# Patient Record
Sex: Female | Born: 1980 | Race: White | Hispanic: No | Marital: Single
Health system: Southern US, Community
[De-identification: ages and names within clinical notes are randomized; demographics above are authoritative.]

## PROBLEM LIST (undated history)

## (undated) DIAGNOSIS — I10 Essential (primary) hypertension: Secondary | ICD-10-CM

---

## 2016-01-13 ENCOUNTER — Inpatient Hospital Stay (HOSPITAL_COMMUNITY): Payer: Medicaid Other

## 2016-01-13 ENCOUNTER — Inpatient Hospital Stay (HOSPITAL_COMMUNITY)
Admission: AD | Admit: 2016-01-13 | Discharge: 2016-01-14 | Disposition: A | Discharge status: Home / Self Care | Payer: Medicaid Other | Source: Ambulatory Visit | Attending: Obstetrics and Gynecology | Admitting: Obstetrics and Gynecology

## 2016-01-13 ENCOUNTER — Encounter (HOSPITAL_COMMUNITY): Payer: Self-pay

## 2016-01-13 DIAGNOSIS — N939 Abnormal uterine and vaginal bleeding, unspecified: Secondary | ICD-10-CM | POA: Diagnosis present

## 2016-01-13 HISTORY — DX: Essential (primary) hypertension: I10

## 2016-01-13 LAB — URINALYSIS, ROUTINE W REFLEX MICROSCOPIC
BILIRUBIN URINE: NEGATIVE
Glucose, UA: NEGATIVE mg/dL
HGB URINE DIPSTICK: NEGATIVE
KETONES UR: NEGATIVE mg/dL
Leukocytes, UA: NEGATIVE
NITRITE: NEGATIVE
PROTEIN: NEGATIVE mg/dL
Specific Gravity, Urine: >1.03 — ABNORMAL HIGH (ref 1.005–1.030)
pH: 5.5 (ref 5.0–8.0)

## 2016-01-13 LAB — CBC
HEMATOCRIT: 25.3 % — AB (ref 36.0–46.0)
Hemoglobin: 8.3 g/dL — ABNORMAL LOW (ref 12.0–15.0)
MCH: 27.9 pg (ref 26.0–34.0)
MCHC: 32.8 g/dL (ref 30.0–36.0)
MCV: 85.2 fL (ref 78.0–100.0)
PLATELETS: 598 10*3/uL — AB (ref 150–400)
RBC: 2.97 MIL/uL — AB (ref 3.87–5.11)
RDW: 14.4 % (ref 11.5–15.5)
WBC: 12.6 10*3/uL — AB (ref 4.0–10.5)

## 2016-01-13 NOTE — MAU Provider Note (Signed)
  History     CSN: [id number]  Arrival date and time: 01/13/16 2232   First Provider Initiated Contact with Patient 01/13/16 2301      Chief Complaint  Patient presents with  . Vaginal Bleeding   HPI Comments: Tracey Sanford is a 35 y.o. G1P1 who is [facility]. She states that she has had normal bleeding since the delivery. Then today around 1900 she had a large gush of blood that was "pouring our like water" for a few mins. She also passed a golfball sized clot at that time. She states that she had elevated blood pressure at the beginning of the pregnancy, but she was not put on any medications until postpartum. She states that she is taking labetalol 200mg  BID. She states that she has not taken her second dose today. She denies any HA, visual disturbances or RUQ pain.   Vaginal Bleeding The patient's primary symptoms include vaginal bleeding. This is a new problem. The current episode started today (around 1900). The problem occurs intermittently. The problem has been gradually worsening. The patient is experiencing no pain. She is not pregnant. Associated symptoms include nausea. Pertinent negatives include no abdominal pain, chills, constipation, diarrhea, dysuria, fever, frequency, urgency or vomiting. The vaginal bleeding is heavier than menses. She has been passing clots (about the size of a golfball. ). She has not been passing tissue. Nothing aggravates the symptoms. She has tried nothing for the symptoms.   Past Medical History  Diagnosis Date  . Hypertension     History reviewed. No pertinent past surgical history.  History reviewed. No pertinent family history.  Social History  Substance Use Topics  . Smoking status: None  . Smokeless tobacco: None  . Alcohol Use: None    Allergies: Allergies not on file  No prescriptions prior to admission    Review of Systems  Constitutional: Negative for fever and chills.  Gastrointestinal: Positive for nausea.  Negative for vomiting, abdominal pain, diarrhea and constipation.  Genitourinary: Positive for vaginal bleeding. Negative for dysuria, urgency and frequency.   Physical Exam   Blood pressure 156/82, pulse 90, temperature 98.2 F (36.8 C), temperature source Oral, resp. rate 18, SpO2 100 %.  Physical Exam  Nursing note and vitals reviewed. Constitutional: She is oriented to person, place, and time. She appears well-developed and well-nourished. No distress.  HENT:  Head: Normocephalic.  Cardiovascular: Normal rate.   Respiratory: Effort normal.  GI: Soft. There is no tenderness. There is no rebound.  Genitourinary:  Fundus: @the  SP Scant amount of bleeding at this time.   Neurological: She is alert and oriented to person, place, and time.  Skin: Skin is warm and dry.  Psychiatric: She has a normal mood and affect.    MAU Course  Procedures  MDM 0055: D/W Dr. Jolayne Pantheronstant. Will place cytotec here, and then patient can be DC home. [facility].   Assessment and Plan   1. Retained products of conception after delivery without hemorrhage    DC home Comfort measures reviewed   Bleeding precautions RX: none, continue all medications. Increase iron to TID Return to MAU as needed FU with OB as planned  Follow-up Information    Call in 1 day to follow up.   Contact information:   [facility]         Jahmad Petrich, Franco ColletHeather Donovan 01/13/2016, 11:02 PM

## 2016-01-13 NOTE — MAU Note (Signed)
Pt presents with complaint of s/p vaginal delivery on 04/07 with onset of heavier vaginal bleeding and passed large clots at home tonight. [facility] Reports mild lower abd cramping at this time, 7/10.

## 2016-01-14 LAB — COMPREHENSIVE METABOLIC PANEL
ALK PHOS: 80 U/L (ref 38–126)
ALT: 11 U/L — AB (ref 14–54)
AST: 13 U/L — AB (ref 15–41)
Albumin: 3.1 g/dL — ABNORMAL LOW (ref 3.5–5.0)
Anion gap: 9 (ref 5–15)
BILIRUBIN TOTAL: 0.2 mg/dL — AB (ref 0.3–1.2)
BUN: 14 mg/dL (ref 6–20)
CALCIUM: 8.9 mg/dL (ref 8.9–10.3)
CHLORIDE: 109 mmol/L (ref 101–111)
CO2: 25 mmol/L (ref 22–32)
CREATININE: 0.56 mg/dL (ref 0.44–1.00)
Glucose, Bld: 110 mg/dL — ABNORMAL HIGH (ref 65–99)
Potassium: 3 mmol/L — ABNORMAL LOW (ref 3.5–5.1)
Sodium: 143 mmol/L (ref 135–145)
TOTAL PROTEIN: 6.7 g/dL (ref 6.5–8.1)

## 2016-01-14 LAB — PROTEIN / CREATININE RATIO, URINE
CREATININE, URINE: 251 mg/dL
Protein Creatinine Ratio: 0.08 mg/mg{Cre} (ref 0.00–0.15)
TOTAL PROTEIN, URINE: 19 mg/dL

## 2016-01-14 MED ORDER — IBUPROFEN 800 MG PO TABS
800.0000 mg | ORAL_TABLET | Freq: Once | ORAL | Status: AC
  Administered 2016-01-14: 800 mg via ORAL
  Filled 2016-01-14: qty 1

## 2016-01-14 MED ORDER — MISOPROSTOL 200 MCG PO TABS
800.0000 ug | ORAL_TABLET | Freq: Once | ORAL | Status: AC
  Administered 2016-01-14: 800 ug via RECTAL
  Filled 2016-01-14: qty 4

## 2016-01-14 NOTE — MAU Note (Signed)
Patient states she took her second dose of labetalol.

## 2016-01-14 NOTE — Discharge Instructions (Signed)
Postpartum Hemorrhage °Postpartum hemorrhage is excessive blood loss after childbirth. Some blood loss is normal after delivering a baby. However, postpartum hemorrhage is a potentially serious condition.  °CAUSES  °· A loss of muscle tone in the uterus after childbirth. °· Failure to deliver all of the placenta. °· Wounds in the birth canal caused by delivery of the fetus. °· A maternal bleeding disorder that prevents blood clotting (rare). °RISK FACTORS °You are at greater risk for postpartum hemorrhage if you: °· Have a history of postpartum hemorrhage. °· Have delivered more than one baby. °· Had preeclampsia or eclampsia. °· Had problems with the placenta. °· Had complications during your labor or delivery. °· Are obese. °· Are Asian or Hispanic. °SIGNS AND SYMPTOMS  °Vaginal bleeding after delivery is normal and should be expected. Bleeding (lochia) will occur for several days after childbirth. This can be expected with normal vaginal deliveries and cesarean deliveries.  °You are bleeding too much after your delivery if you are: °· Passing large clots or pieces of tissue. This may be small pieces of placenta left after delivery.   °· Soaking more than one sanitary pad per hour for several hours.   °· Having heavy, bright-red bleeding that occurs 4 days or more after delivery.   °· Having a discharge that has a bad smell or if you begin to run an unexplained fever.   °· Having times of lightheadedness or fainting, feeling short of breath, or having your heart beat fast with very little activity.   °DIAGNOSIS  °A diagnosis is based on your symptoms and a physical exam of your perineum, vagina, cervix, and uterus. Diagnostic tests may include: °· Blood pressure and pulse. °· Blood tests. °· Blood clotting tests. °· Ultrasonography. °TREATMENT °· Treatment is based on the severity of bleeding and may include: °¨ Uterine massage. °¨ Medicines. °¨ Blood transfusions. °· Sometimes bleeding occurs if portions of the  placenta are left behind in the uterus after delivery. If this happens, often a curettage or scraping of the inside of the uterus must be done. This usually stops the bleeding. If this treatment does not stop the bleeding, surgery (hysterectomy) may have to be performed to remove the uterus. °· If bleeding is due to clotting or bleeding problems that are not related to the pregnancy, other treatments may be needed.   °HOME CARE INSTRUCTIONS  °· Limit your activity as directed by your health care provider. Your health care provider may order bed rest (getting up to the bathroom only) or may allow you to continue light activity.   °· Keep track of the number of pads you use each day and how soaked (saturated) they are. Write this number down.   °· Do not use tampons. Do not douche or have sexual intercourse until approved by your health care provider.   °· Drink enough fluids to keep your urine clear or pale yellow.   °· Get proper amounts of rest.   °· Eat foods that are rich in iron, such as spinach, red meat, and legumes.   °SEEK IMMEDIATE MEDICAL CARE IF: °· You experience severe cramps in your stomach, back, or belly (abdomen).   °· You have a fever.   °· You pass large clots or tissue. Save any tissue for your health care provider to look at.   °· Your bleeding increases. °· You become weak or lightheaded, or you pass out.   °· Your sanitary pad count per hour is increasing. °MAKE SURE YOU: °· Understand these instructions. °· Will watch your condition. °· Will get help right away if   you are not doing well or get worse. °  °This information is not intended to replace advice given to you by your health care provider. Make sure you discuss any questions you have with your health care provider. °  °Document Released: 12/02/2003 Document Revised: 09/16/2013 Document Reviewed: 02/27/2013 °Elsevier Interactive Patient Education ©2016 [employer] ° °

## 2017-01-25 IMAGING — US US PELVIS COMPLETE
1 series · 15 of 25 positions shown · non-contrast
Comparison: None.

CLINICAL DATA: Heavy postpartum vaginal bleeding. Vaginal delivery
2 and half weeks prior 12/31/2015. Patient had D and C 1 hour after
delivery secondary to retained placenta.

EXAM:
TRANSABDOMINAL ULTRASOUND OF PELVIS
TECHNIQUE: Transabdominal ultrasound examination of the pelvis was performed
including evaluation of the uterus, ovaries, adnexal regions, and
pelvic cul-de-sac. Patient declined transvaginal evaluation.

[Series 1: us pelvis complete · 36 acquisitions, 15 frames shown]
[im 1/36]
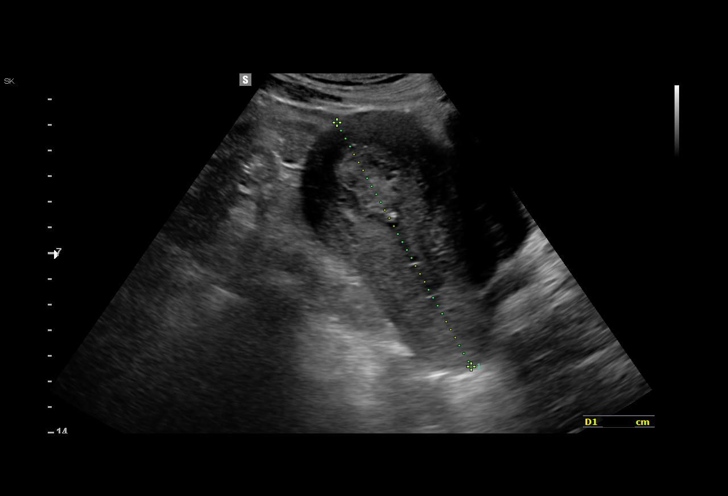
[im 3/36]
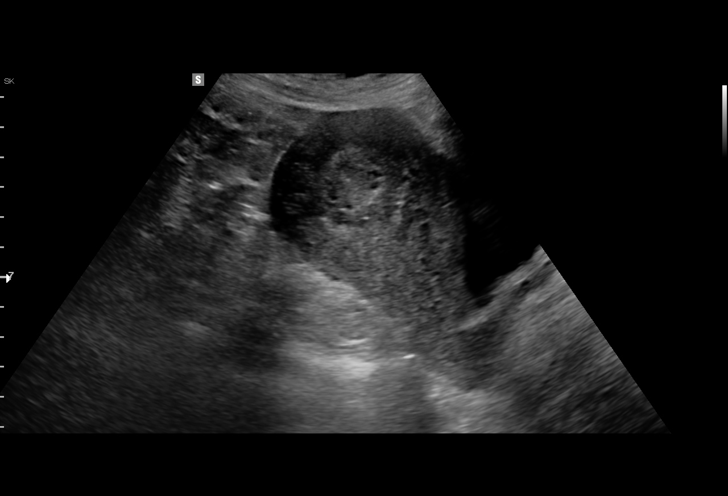
[im 6/36]
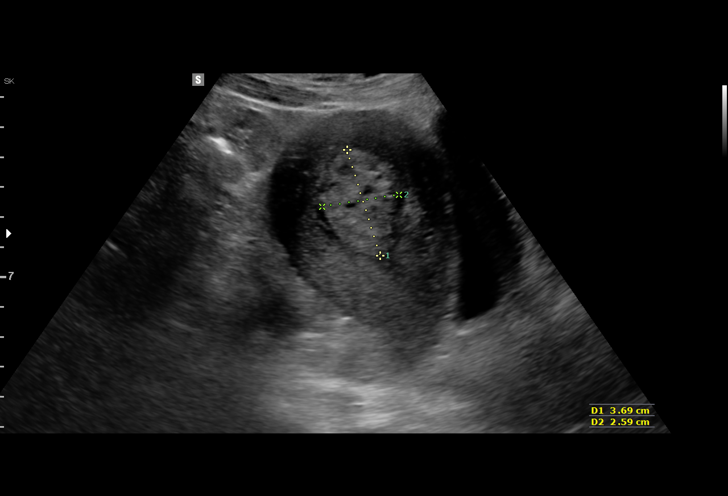
[im 8/36]
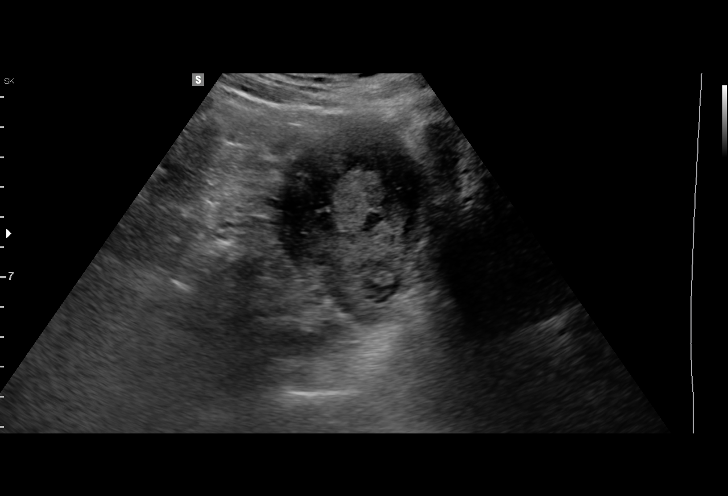
[im 11/36]
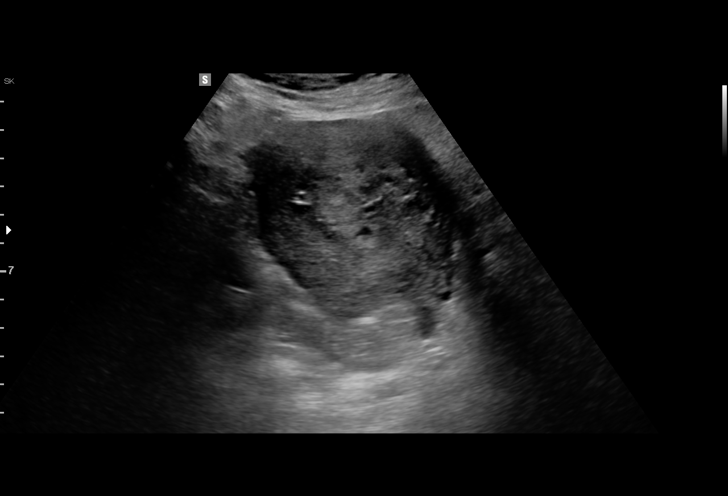
[im 14/36]
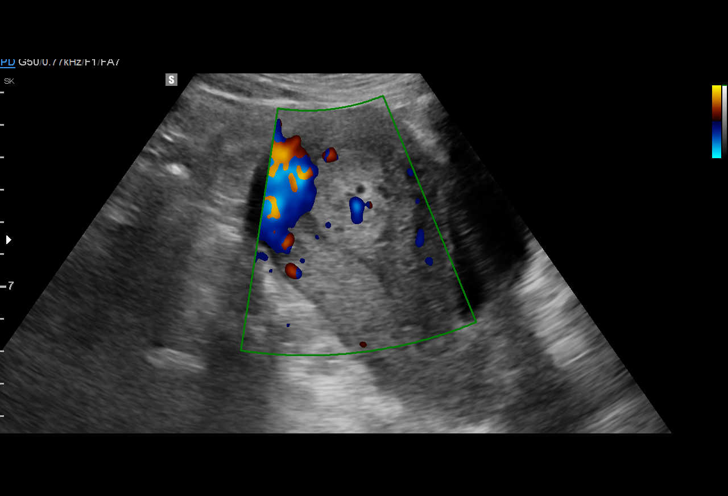
[im 15/36]
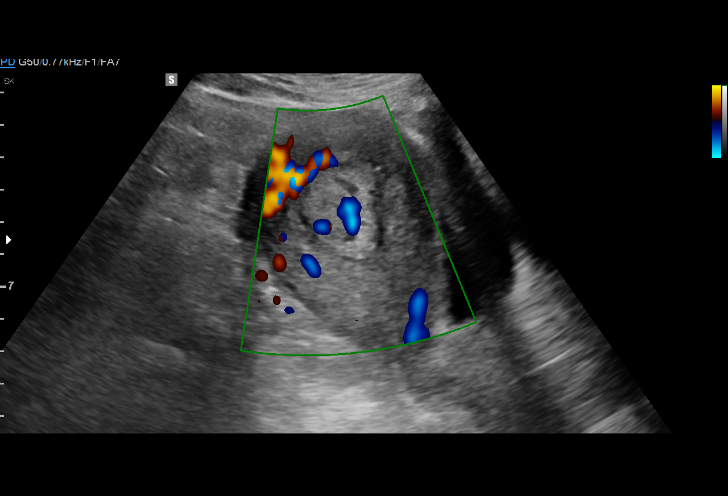
[im 18/36]
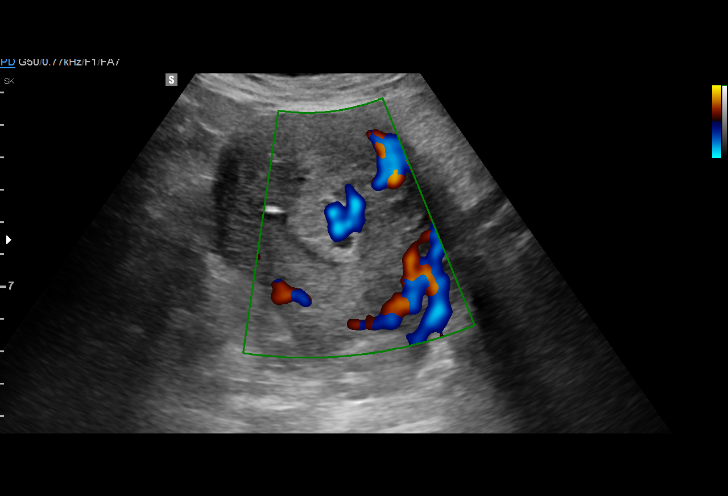
[im 21/36]
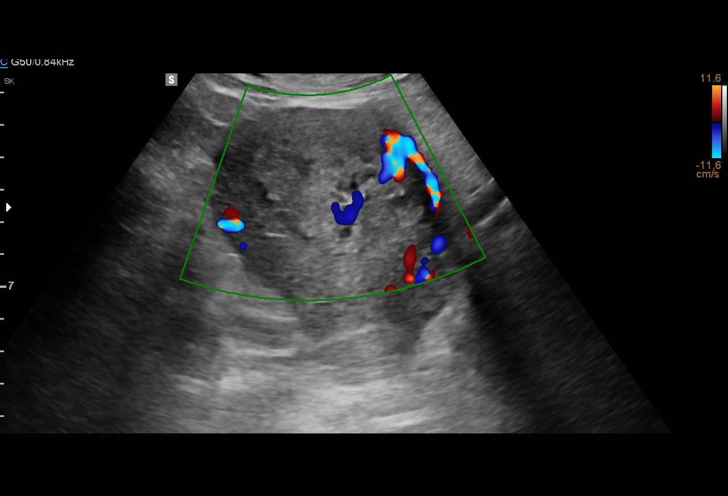
[im 22/36]
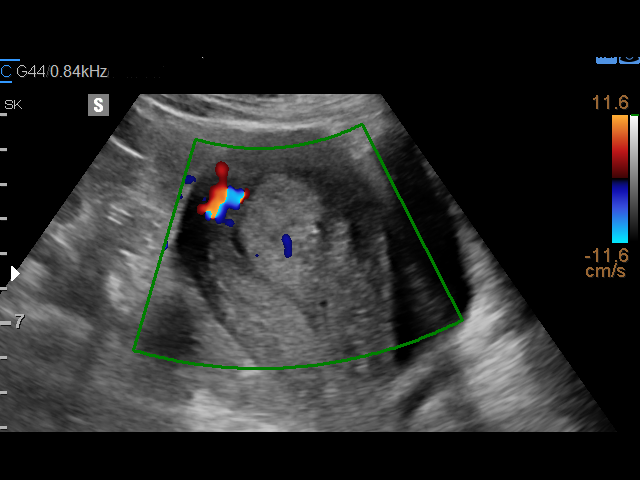
[im 25/36]
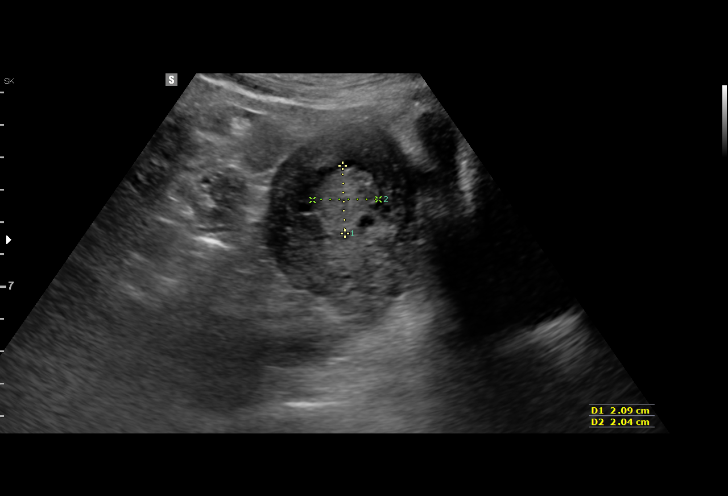
[im 28/36]
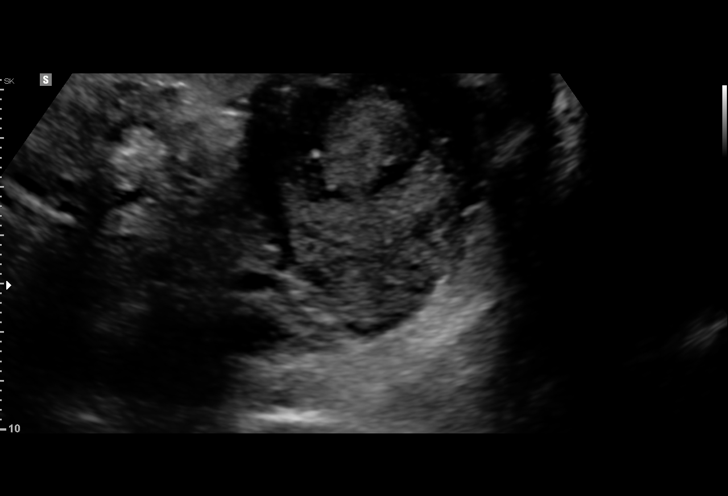
[im 30/36]
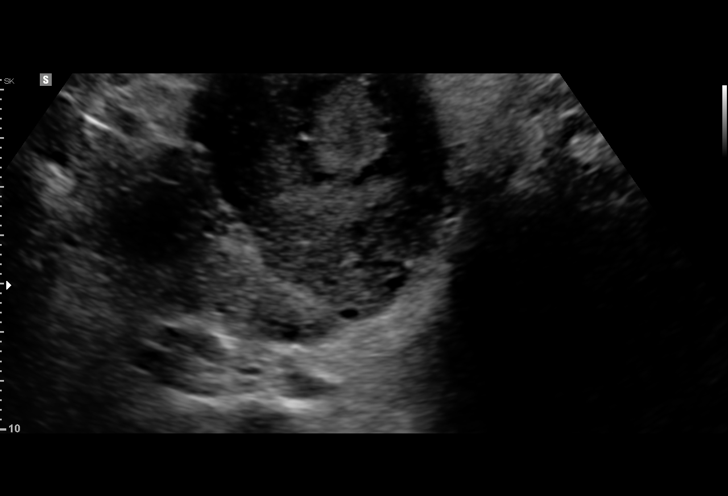
[im 33/36]
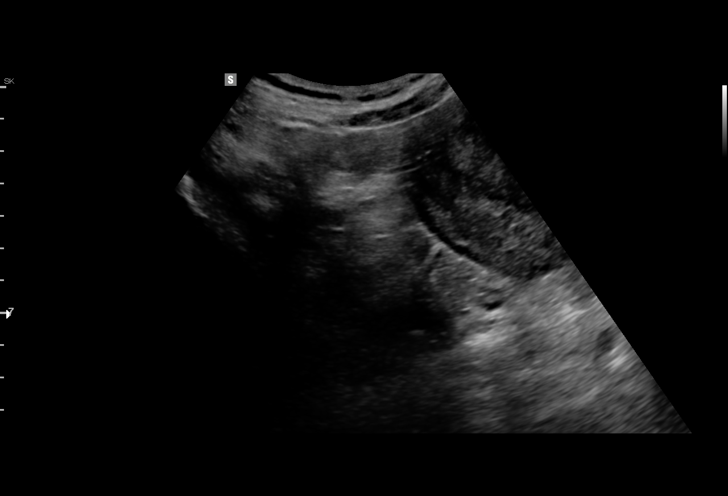
[im 36/36]
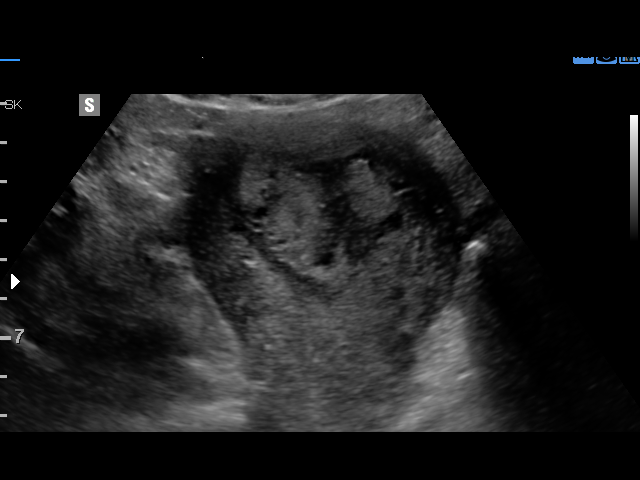

[15 of 25 positions shown; findings below may reference images not displayed]

FINDINGS: Uterus

Measurements: 10.8 x 5.3 x 7.3 cm. No fibroids or other mass
visualized.

Endometrium

Thickness: Focal echogenic and heterogeneous rounded lesion in the
endometrium measures 3.7 x 2.5 x 4.1 cm. There is internal blood
flow. The endometrium is expanded measuring 26 mm.

Right ovary

Measurements: Not visualized.

Left ovary

Measurements: 2.1 x 1.5 x 1.5 cm. Normal appearance/no adnexal mass.

Other findings:  No abnormal free fluid.
IMPRESSION: Heterogeneous echogenic lesion in the endometrium measuring 4.1 cm
greatest dimension with internal blood flow, most concerning for
retained products of conception.
